# Patient Record
Sex: Female | Born: 1985 | Race: White | Hispanic: No | Marital: Married | Smoking: Never smoker
Health system: Southern US, Community
[De-identification: ages and names within clinical notes are randomized; demographics above are authoritative.]

## PROBLEM LIST (undated history)

## (undated) DIAGNOSIS — J45909 Unspecified asthma, uncomplicated: Secondary | ICD-10-CM

## (undated) HISTORY — PX: TONSILLECTOMY: SUR1361

## (undated) HISTORY — PX: SEPTOPLASTY: SHX2393

---

## 2020-04-18 ENCOUNTER — Ambulatory Visit
Admission: EM | Admit: 2020-04-18 | Discharge: 2020-04-18 | Disposition: A | Discharge status: Home / Self Care | Payer: Managed Care, Other (non HMO)

## 2020-04-18 ENCOUNTER — Other Ambulatory Visit: Payer: Self-pay

## 2020-04-18 ENCOUNTER — Encounter: Payer: Self-pay | Admitting: Emergency Medicine

## 2020-04-18 DIAGNOSIS — M79672 Pain in left foot: Secondary | ICD-10-CM | POA: Diagnosis not present

## 2020-04-18 DIAGNOSIS — L03116 Cellulitis of left lower limb: Secondary | ICD-10-CM

## 2020-04-18 HISTORY — DX: Unspecified asthma, uncomplicated: J45.909

## 2020-04-18 MED ORDER — DOXYCYCLINE HYCLATE 100 MG PO CAPS
100.0000 mg | ORAL_CAPSULE | Freq: Two times a day (BID) | ORAL | 0 refills | Status: AC
Start: 2020-04-18 — End: ?

## 2020-04-18 NOTE — Discharge Instructions (Addendum)
Take the antibiotic if you are noticing more redness, tenderness, and swelling to the area by tomorrow  Take tylenol or ibuprofen as needed for pain  Follow up as needed

## 2020-04-18 NOTE — ED Triage Notes (Signed)
Pt states that over the weekend she was [place] the beach and stepped on a "barnicle".she states the pain id getting worse and wants to make sure it is not infected. She states her last tetanus was about a year ago.

## 2020-04-19 NOTE — ED Provider Notes (Signed)
[facility]   [id number] 04/18/20 Arrival Time: 1730  GY:JEHUD PAIN  SUBJECTIVE: History from: patient. Jordan Ray is a 34 y.o. female complains of left foot pain that began 3 days ago. Reports that she was a the beach over the weekend and stepped on a barnacle. Reports painful puncture wound to the middle of the bottom of her left foot. Denies drainage. Describes the pain as intermittent and achy in character. Has tried OTC medications without relief. Symptoms are made worse with being on her feet. Denies similar symptoms in the past. Denies fever, chills, erythema, ecchymosis, effusion, weakness, numbness and tingling, saddle paresthesias, loss of bowel or bladder function.      ROS: As per HPI.  All other pertinent ROS negative.     Past Medical History:  Diagnosis Date  . Asthma    Past Surgical History:  Procedure Laterality Date  . SEPTOPLASTY    . TONSILLECTOMY     Allergies  Allergen Reactions  . Cephalosporins Swelling   No current facility-administered medications on file prior to encounter.   Current Outpatient Medications on File Prior to Encounter  Medication Sig Dispense Refill  . albuterol (VENTOLIN HFA) 108 (90 Base) MCG/ACT inhaler Inhale into the lungs every 6 (six) hours as needed for wheezing or shortness of breath.    . loratadine (CLARITIN) 10 MG tablet Take 10 mg by mouth daily.    Marland Kitchen spironolactone (ALDACTONE) 50 MG tablet Take 50 mg by mouth daily.    Marland Kitchen venlafaxine XR (EFFEXOR XR) 75 MG 24 hr capsule Take 75 mg by mouth daily with breakfast.     Social History   Socioeconomic History  . Marital status: Married    Spouse name: Not on file  . Number of children: Not on file  . Years of education: Not on file  . Highest education level: Not on file  Occupational History  . Not on file  Tobacco Use  . Smoking status: Never Smoker  . Smokeless tobacco: Never Used  Substance and Sexual Activity  . Alcohol use: Not Currently  .  Drug use: Not Currently  . Sexual activity: Not on file  Other Topics Concern  . Not on file  Social History Narrative  . Not on file   Social Determinants of Health   Financial Resource Strain:   . Difficulty of Paying Living Expenses:   Food Insecurity:   . Worried About Charity fundraiser in the Last Year:   . Arboriculturist in the Last Year:   Transportation Needs:   . Film/video editor (Medical):   Marland Kitchen Lack of Transportation (Non-Medical):   Physical Activity:   . Days of Exercise per Week:   . Minutes of Exercise per Session:   Stress:   . Feeling of Stress :   Social Connections:   . Frequency of Communication with Friends and Family:   . Frequency of Social Gatherings with Friends and Family:   . Attends Religious Services:   . Active Member of Clubs or Organizations:   . Attends Archivist Meetings:   Marland Kitchen Marital Status:   Intimate Partner Violence:   . Fear of Current or Ex-Partner:   . Emotionally Abused:   Marland Kitchen Physically Abused:   . Sexually Abused:    Family History  Problem Relation Age of Onset  . Healthy Mother   . Healthy Father     OBJECTIVE:  Vitals:   04/18/20 1732  BP: 116/76  Pulse:  83  Resp: 18  Temp: 98.6 F (37 C)  TempSrc: Oral  SpO2: 97%  Weight: 150 lb (68 kg)  Height: 5\' 3"  (1.6 m)    General appearance: ALERT; in no acute distress.  Head: NCAT Lungs: Normal respiratory effort CV: pedal pulses 2+ bilaterally. Cap refill < 2 seconds Musculoskeletal:  Inspection: Skin warm, dry, clear and intact without obvious erythema, effusion, or ecchymosis.  Palpation: Nontender to palpation ROM: FROM active and passive Skin: warm and dry, 4mm puncture wound to middle of sole of L foot, no drainage, skin is intact, erythematous, tender to palpation Neurologic: Ambulates without difficulty; Sensation intact about the upper/ lower extremities Psychological: alert and cooperative; normal mood and affect  DIAGNOSTIC  STUDIES:  No results found.   ASSESSMENT & PLAN:  1. Left foot pain   2. Cellulitis of left lower extremity      Meds ordered this encounter  Medications  . doxycycline (VIBRAMYCIN) 100 MG capsule    Sig: Take 1 capsule (100 mg total) by mouth 2 (two) times daily.    Dispense:  14 capsule    Refill:  0    Order Specific Question:   Supervising Provider    Answer:   1m Merrilee Jansky    Left Foot Pain Cellulitis Prescribed doxycycline Take as directed and to completion Continue conservative management of rest, ice, and gentle stretches Take naproxen as needed for pain relief (may cause abdominal discomfort, ulcers, and GI bleeds avoid taking with other NSAIDs) Follow up with PCP if symptoms persist Return or go to the ER if you have any new or worsening symptoms (fever, chills, chest pain, abdominal pain, changes in bowel or bladder habits, pain radiating into lower legs)   Reviewed expectations re: course of current medical issues. Questions answered. Outlined signs and symptoms indicating need for more acute intervention. Patient verbalized understanding. After Visit Summary given.       [[id number]], NP 04/19/20 [phone]

## 2020-06-20 ENCOUNTER — Other Ambulatory Visit: Payer: Self-pay

## 2020-06-20 ENCOUNTER — Ambulatory Visit
Admission: EM | Admit: 2020-06-20 | Discharge: 2020-06-20 | Disposition: A | Discharge status: Home / Self Care | Payer: Managed Care, Other (non HMO) | Attending: Emergency Medicine | Admitting: Emergency Medicine

## 2020-06-20 DIAGNOSIS — R42 Dizziness and giddiness: Secondary | ICD-10-CM

## 2020-06-20 LAB — POCT URINALYSIS DIP (MANUAL ENTRY)
Bilirubin, UA: NEGATIVE
Blood, UA: NEGATIVE
Glucose, UA: NEGATIVE mg/dL
Ketones, POC UA: NEGATIVE mg/dL
Leukocytes, UA: NEGATIVE
Nitrite, UA: NEGATIVE
Protein Ur, POC: NEGATIVE mg/dL
Spec Grav, UA: 1.02 (ref 1.010–1.025)
Urobilinogen, UA: 0.2 E.U./dL
pH, UA: 7.5 (ref 5.0–8.0)

## 2020-06-20 LAB — POCT URINE PREGNANCY: Preg Test, Ur: NEGATIVE

## 2020-06-20 NOTE — Discharge Instructions (Addendum)
Go to the emergency department if you have acute worsening symptoms.    Your Covid test is pending.  You should self quarantine until the test result is back.    Follow-up with your primary care provider as scheduled next week.

## 2020-06-20 NOTE — ED Triage Notes (Signed)
Pt presents to UC for possible vertigo x4 days. Pt states she has had vertigo in past but has not lasted this long. Pt states she has treated with meclizine yesterday with out relief.

## 2020-06-20 NOTE — ED Provider Notes (Signed)
[facility]    CSN: [id number] Arrival date & time: 06/20/20  1523      History   Chief Complaint Chief Complaint  Patient presents with  . Dizziness    HPI Jordan Ray is a 34 y.o. female.   Patient presents with "disequilibrium" x4 days.  She states she has a history of vertigo.  She took OTC Dramamine without relief.  She denies fever, chills, cough, shortness of breath, chest pain, abdominal pain, or other symptoms.    The history is provided by the patient.    Past Medical History:  Diagnosis Date  . Asthma     There are no problems to display for this patient.   Past Surgical History:  Procedure Laterality Date  . SEPTOPLASTY    . TONSILLECTOMY      OB History   No obstetric history on file.      Home Medications    Prior to Admission medications   Medication Sig Start Date End Date Taking? Authorizing Provider  albuterol (VENTOLIN HFA) 108 (90 Base) MCG/ACT inhaler Inhale into the lungs every 6 (six) hours as needed for wheezing or shortness of breath.    [provider]  doxycycline (VIBRAMYCIN) 100 MG capsule Take 1 capsule (100 mg total) by mouth 2 (two) times daily. 04/18/20   Moshe Cipro, NP  loratadine (CLARITIN) 10 MG tablet Take 10 mg by mouth daily.    [provider]  spironolactone (ALDACTONE) 50 MG tablet Take 50 mg by mouth daily.    [provider]  venlafaxine XR (EFFEXOR XR) 75 MG 24 hr capsule Take 75 mg by mouth daily with breakfast.    [provider]    Family History Family History  Problem Relation Age of Onset  . Healthy Mother   . Healthy Father     Social History Social History   Tobacco Use  . Smoking status: Never Smoker  . Smokeless tobacco: Never Used  Vaping Use  . Vaping Use: Never used  Substance Use Topics  . Alcohol use: Not Currently  . Drug use: Not Currently     Allergies   Cephalosporins   Review of Systems Review of Systems    Constitutional: Negative for chills and fever.  HENT: Negative for congestion, ear pain and sore throat.   Eyes: Negative for pain and visual disturbance.  Respiratory: Negative for cough and shortness of breath.   Cardiovascular: Negative for chest pain and palpitations.  Gastrointestinal: Negative for abdominal pain, diarrhea and vomiting.  Genitourinary: Negative for dysuria and hematuria.  Musculoskeletal: Negative for arthralgias and back pain.  Skin: Negative for color change and rash.  Neurological: Positive for dizziness. Negative for seizures, syncope, facial asymmetry, speech difficulty, weakness, numbness and headaches.  All other systems reviewed and are negative.    Physical Exam Triage Vital Signs ED Triage Vitals  Enc Vitals Group     BP      Pulse      Resp      Temp      Temp src      SpO2      Weight      Height      Head Circumference      Peak Flow      Pain Score      Pain Loc      Pain Edu?      Excl. in GC?    No data found.  Updated Vital Signs BP 113/79 (BP  Location: Right Arm)   Pulse 83   Temp 98.2 F (36.8 C) (Oral)   Resp 16   SpO2 97%   Visual Acuity Right Eye Distance:   Left Eye Distance:   Bilateral Distance:    Right Eye Near:   Left Eye Near:    Bilateral Near:     Physical Exam Vitals and nursing note reviewed.  Constitutional:      General: She is not in acute distress.    Appearance: She is well-developed. She is not ill-appearing.  HENT:     Head: Normocephalic and atraumatic.     Right Ear: Tympanic membrane normal.     Left Ear: Tympanic membrane normal.     Nose: Nose normal.     Mouth/Throat:     Mouth: Mucous membranes are moist.     Pharynx: Oropharynx is clear.  Eyes:     Conjunctiva/sclera: Conjunctivae normal.  Cardiovascular:     Rate and Rhythm: Normal rate and regular rhythm.     Heart sounds: No murmur heard.   Pulmonary:     Effort: Pulmonary effort is normal. No respiratory distress.      Breath sounds: Normal breath sounds.  Abdominal:     Palpations: Abdomen is soft.     Tenderness: There is no abdominal tenderness. There is no guarding or rebound.  Musculoskeletal:     Cervical back: Neck supple.  Skin:    General: Skin is warm and dry.     Findings: No rash.  Neurological:     General: No focal deficit present.     Mental Status: She is alert and oriented to person, place, and time.     Cranial Nerves: No cranial nerve deficit.     Sensory: No sensory deficit.     Motor: No weakness.     Coordination: Romberg sign negative. Coordination normal.     Gait: Gait normal.  Psychiatric:        Mood and Affect: Mood normal.        Behavior: Behavior normal.      UC Treatments / Results  Labs (all labs ordered are listed, but only abnormal results are displayed) Labs Reviewed  NOVEL CORONAVIRUS, NAA  CBC  COMPREHENSIVE METABOLIC PANEL  POCT URINE PREGNANCY  POCT URINALYSIS DIP (MANUAL ENTRY)    EKG   Radiology No results found.  Procedures Procedures (including critical care time)  Medications Ordered in UC Medications - No data to display  Initial Impression / Assessment and Plan / UC Course  I have reviewed the triage vital signs and the nursing notes.  Pertinent labs & imaging results that were available during my care of the patient were reviewed by me and considered in my medical decision making (see chart for details).   Dizziness.  PCR COVID pending.  Instructed patient to self quarantine until her test result is back.  EKG shows sinus rhythm, rate 73, no ST elevation, no previous to compare.  Urine normal.  CBC, CMP pending.  Instructed patient to go to the ED if she has acute worsening symptoms.  PCR COVID pending.  Instructed her to self quarantine until the test result is back.  Instructed her to follow-up with her new PCP next week as scheduled.  Patient agrees to plan of care.          Final Clinical Impressions(s) / UC Diagnoses    Final diagnoses:  Dizziness     Discharge Instructions     Go to  the emergency department if you have acute worsening symptoms.    Your Covid test is pending.  You should self quarantine until the test result is back.    Follow-up with your primary care provider as scheduled next week.          ED Prescriptions    None     PDMP not reviewed this encounter.   Mickie Bail, NP 06/20/20 [phone]

## 2020-06-21 ENCOUNTER — Telehealth: Payer: Self-pay | Admitting: Emergency Medicine

## 2020-06-21 LAB — CBC
Hematocrit: 42.1 % (ref 34.0–46.6)
Hemoglobin: 13.8 g/dL (ref 11.1–15.9)
MCH: 30.7 pg (ref 26.6–33.0)
MCHC: 32.8 g/dL (ref 31.5–35.7)
MCV: 94 fL (ref 79–97)
Platelets: 412 10*3/uL (ref 150–450)
RBC: 4.49 x10E6/uL (ref 3.77–5.28)
RDW: 12.4 % (ref 11.7–15.4)
WBC: 8.6 10*3/uL (ref 3.4–10.8)

## 2020-06-21 LAB — COMPREHENSIVE METABOLIC PANEL
ALT: 19 IU/L (ref 0–32)
AST: 24 IU/L (ref 0–40)
Albumin/Globulin Ratio: 2.4 — ABNORMAL HIGH (ref 1.2–2.2)
Albumin: 5.3 g/dL — ABNORMAL HIGH (ref 3.8–4.8)
Alkaline Phosphatase: 48 IU/L (ref 48–121)
BUN/Creatinine Ratio: 14 (ref 9–23)
BUN: 10 mg/dL (ref 6–20)
Bilirubin Total: <0.2 mg/dL (ref 0.0–1.2)
CO2: 22 mmol/L (ref 20–29)
Calcium: 9.7 mg/dL (ref 8.7–10.2)
Chloride: 103 mmol/L (ref 96–106)
Creatinine, Ser: 0.7 mg/dL (ref 0.57–1.00)
GFR calc Af Amer: 132 mL/min/{1.73_m2} (ref >59)
GFR calc non Af Amer: 114 mL/min/{1.73_m2} (ref >59)
Globulin, Total: 2.2 g/dL (ref 1.5–4.5)
Glucose: 75 mg/dL (ref 65–99)
Potassium: 4.4 mmol/L (ref 3.5–5.2)
Sodium: 141 mmol/L (ref 134–144)
Total Protein: 7.5 g/dL (ref 6.0–8.5)

## 2020-06-21 NOTE — Telephone Encounter (Signed)
Called patient and reviewed lab results.  Instructed her to follow up with her PCP as scheduled/needed.  Patient agrees to plan of care.

## 2020-06-22 LAB — NOVEL CORONAVIRUS, NAA: SARS-CoV-2, NAA: NOT DETECTED

## 2020-06-22 LAB — SARS-COV-2, NAA 2 DAY TAT

## 2020-06-30 ENCOUNTER — Other Ambulatory Visit: Payer: Self-pay | Admitting: Family Medicine

## 2020-06-30 DIAGNOSIS — Z1231 Encounter for screening mammogram for malignant neoplasm of breast: Secondary | ICD-10-CM

## 2020-07-06 ENCOUNTER — Ambulatory Visit
Admission: RE | Admit: 2020-07-06 | Discharge: 2020-07-06 | Disposition: A | Discharge status: Home / Self Care | Payer: Managed Care, Other (non HMO) | Source: Ambulatory Visit | Attending: Family Medicine | Admitting: Family Medicine

## 2020-07-06 ENCOUNTER — Other Ambulatory Visit: Payer: Self-pay

## 2020-07-06 DIAGNOSIS — Z1231 Encounter for screening mammogram for malignant neoplasm of breast: Secondary | ICD-10-CM

## 2020-09-28 IMAGING — MG DIGITAL SCREENING BILAT W/ TOMO W/ CAD
8 series · 9 of 24 positions shown · non-contrast
Comparison: None.

CLINICAL DATA: Screening. The patient has a cousin who is BRCA
positive.

EXAM:
DIGITAL SCREENING BILATERAL MAMMOGRAM WITH TOMO AND CAD

[R MLO synth-2D]
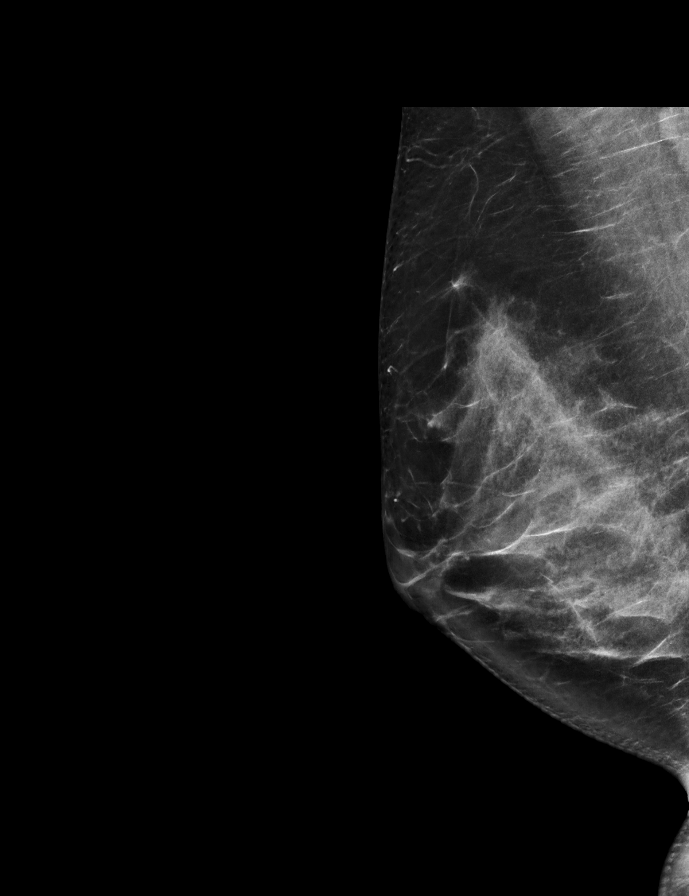

[R CC synth-2D]
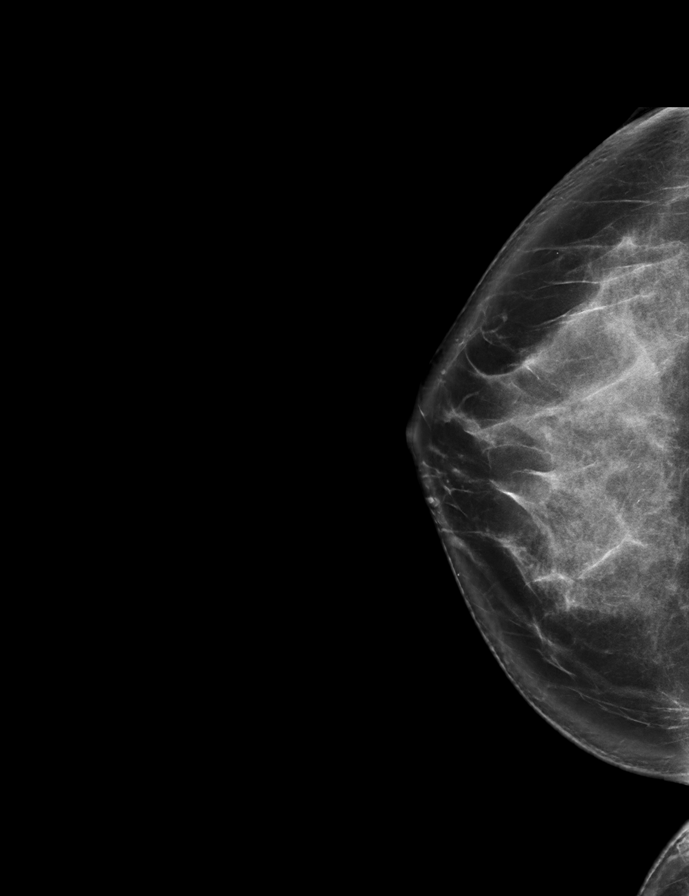

[L MLO synth-2D]
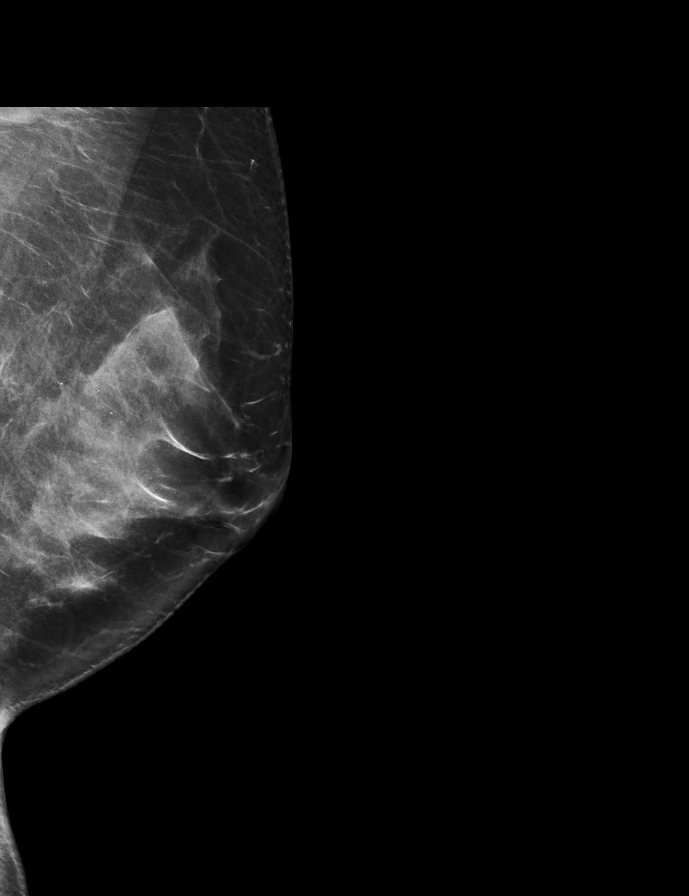

[L CC synth-2D]
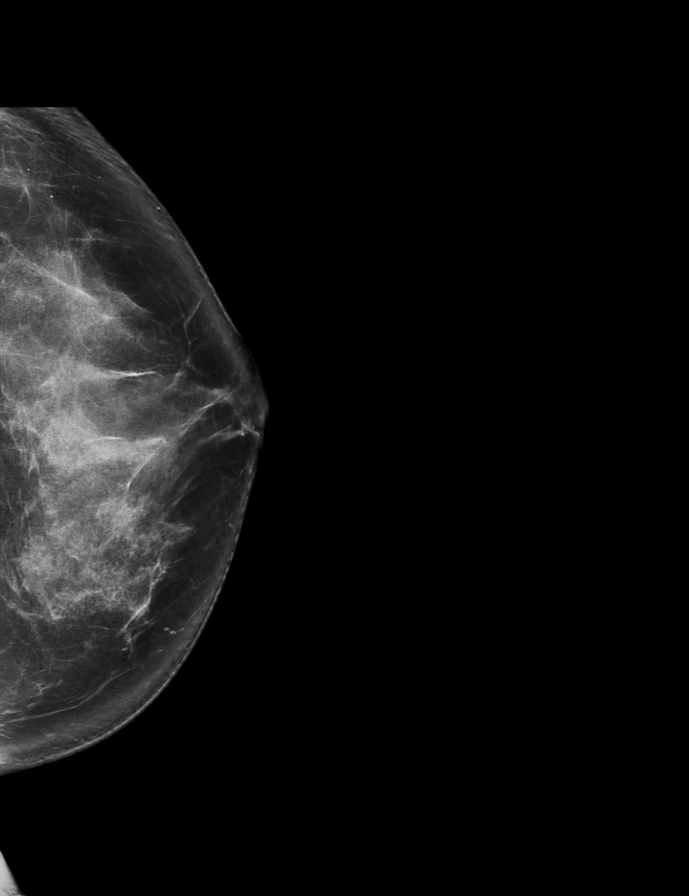

[R CC tomo · 2 of 85 frames shown]
[frame 28/85]
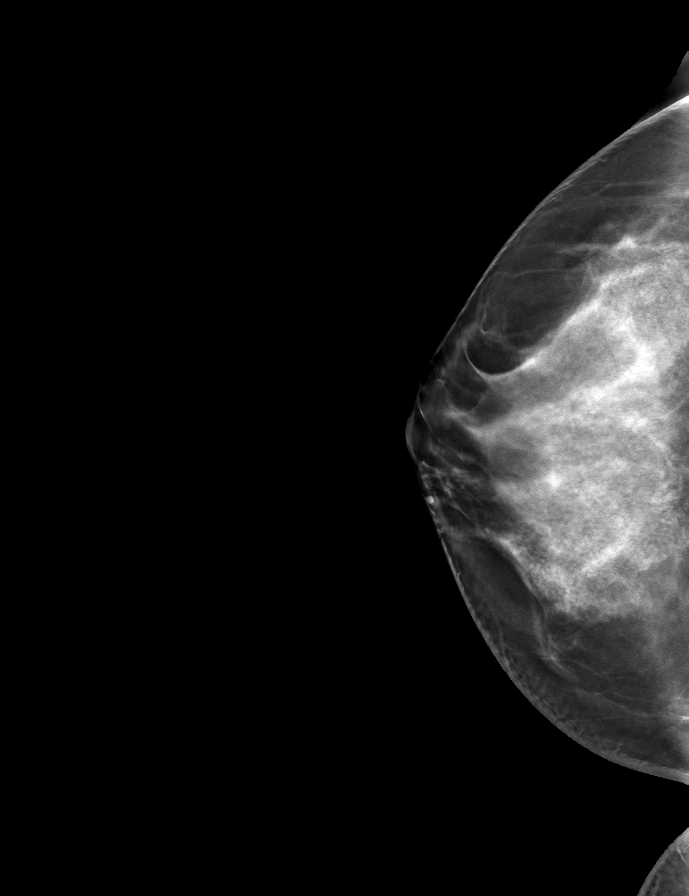
[frame 43/85]
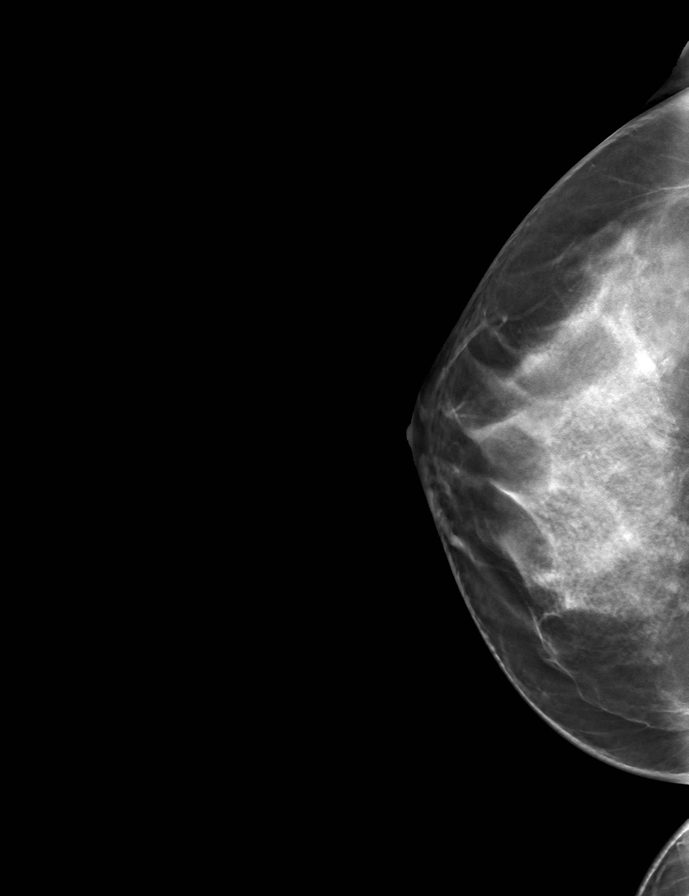

[R MLO tomo · tomo slice 39/76.0]
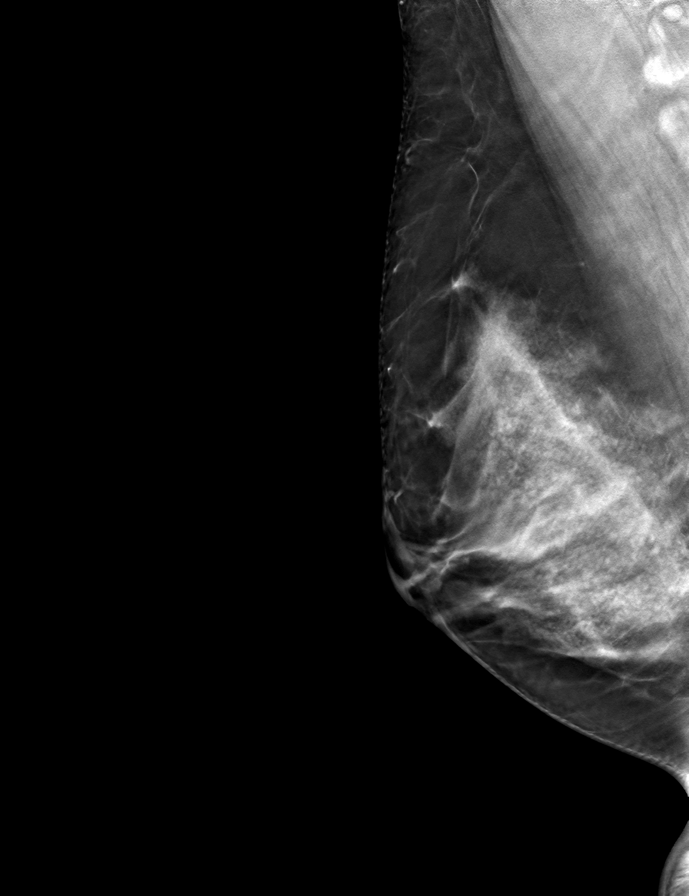

[L MLO tomo · tomo slice 41/81.0]
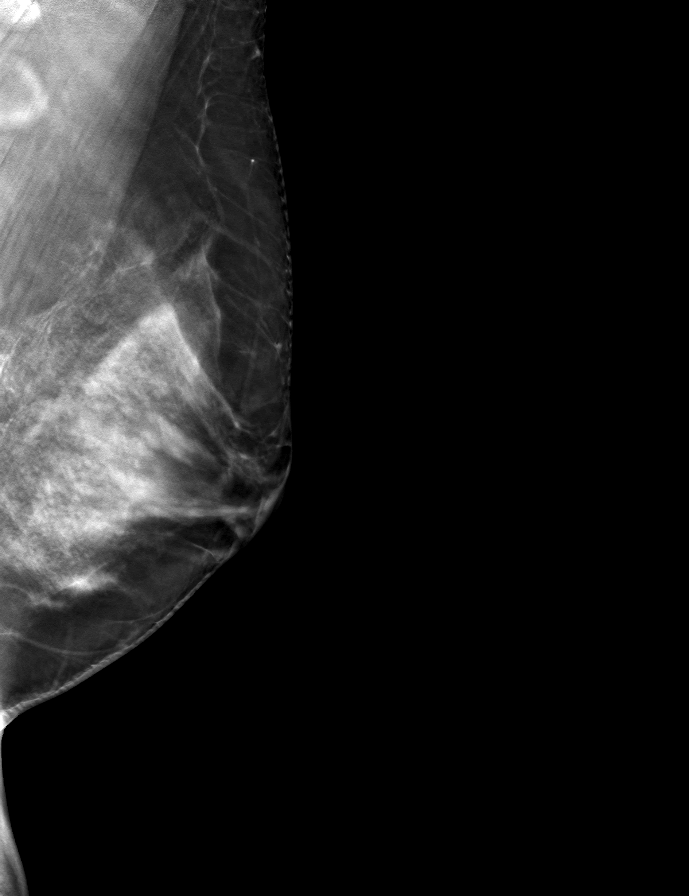

[L CC tomo · tomo slice 40/79.0]
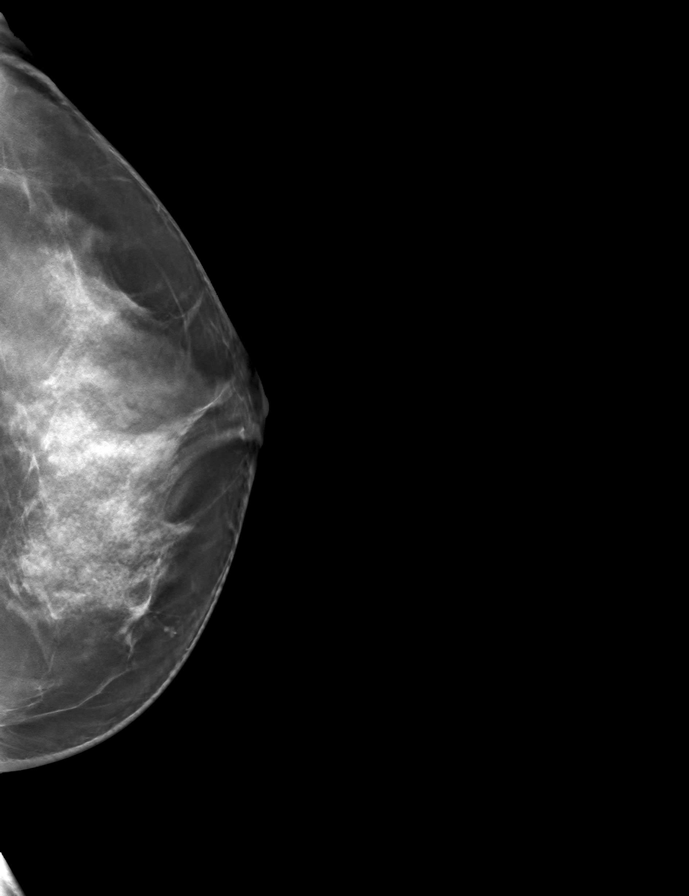

[9 of 24 positions shown; findings below may reference images not displayed]

ACR Breast Density Category d: The breast tissue is extremely dense,
which lowers the sensitivity of mammography.
FINDINGS: There are no findings suspicious for malignancy. Images were
processed with CAD.
IMPRESSION: No mammographic evidence of malignancy. A result letter of this
screening mammogram will be mailed directly to the patient.

RECOMMENDATION:
Screening mammogram at age 41. (Code:0I-9-EFW)

BI-RADS CATEGORY  1: Negative.

## 2021-03-12 ENCOUNTER — Other Ambulatory Visit (HOSPITAL_COMMUNITY): Payer: Self-pay | Admitting: *Deleted

## 2021-03-13 ENCOUNTER — Other Ambulatory Visit: Payer: Self-pay

## 2021-03-13 ENCOUNTER — Ambulatory Visit (HOSPITAL_COMMUNITY)
Admission: RE | Admit: 2021-03-13 | Discharge: 2021-03-13 | Disposition: A | Discharge status: Home / Self Care | Payer: Managed Care, Other (non HMO) | Source: Ambulatory Visit | Attending: Cardiology | Admitting: Cardiology

## 2021-06-05 IMAGING — CT CT CARDIAC CORONARY ARTERY CALCIUM SCORE
3 series · 14 of 20 positions shown, 15 images · non-contrast
Comparison: None

Addendum:
CLINICAL DATA: Risk stratification: 34 Year-old female

EXAM:
Coronary Calcium Score
TECHNIQUE: The patient was scanned on a Siemens Force scanner. Axial
non-contrast 3 mm slices were carried out through the heart. The
data set was analyzed on a dedicated work station and scored using
the Agatson method.

[Series 3: 2 hrt calcium · axial · 0.39mm/px · z∈[-250,-168]mm · 4 of 45 slices shown, 5 images]
[im 9/45  vessel]
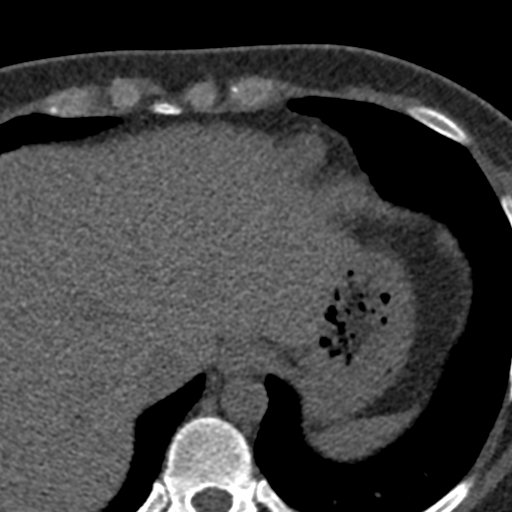
[im 9/45  lung]
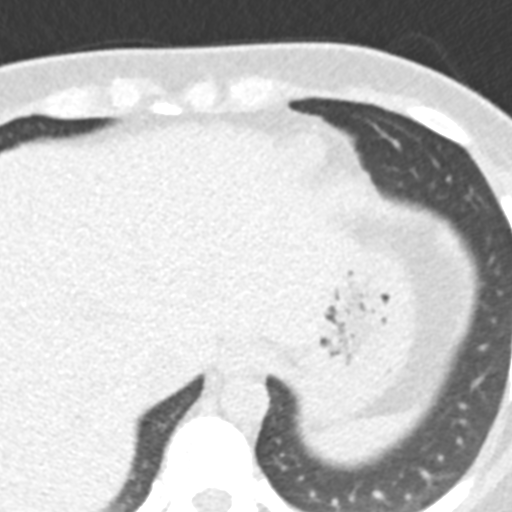
[im 18/45  vessel]
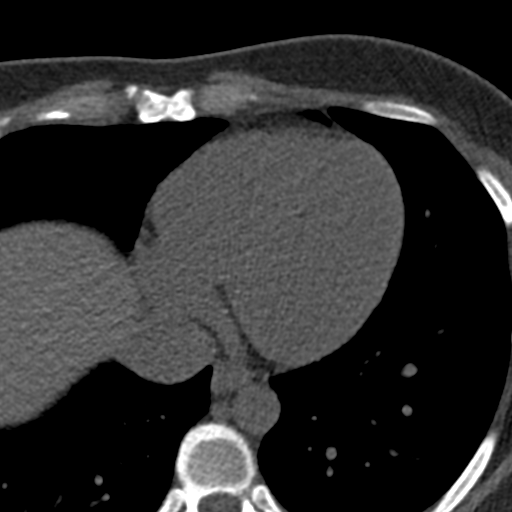
[im 27/45  vessel]
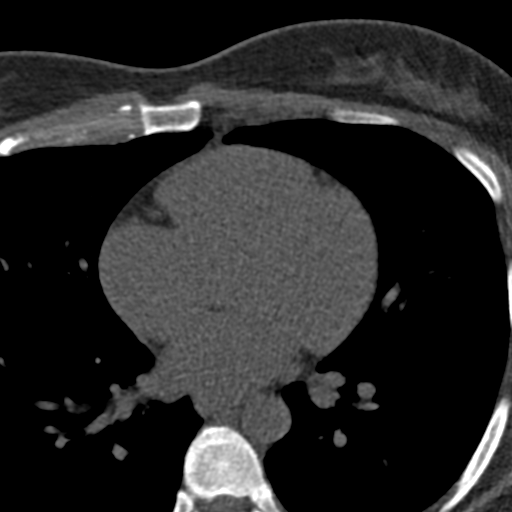
[im 36/45  vessel]
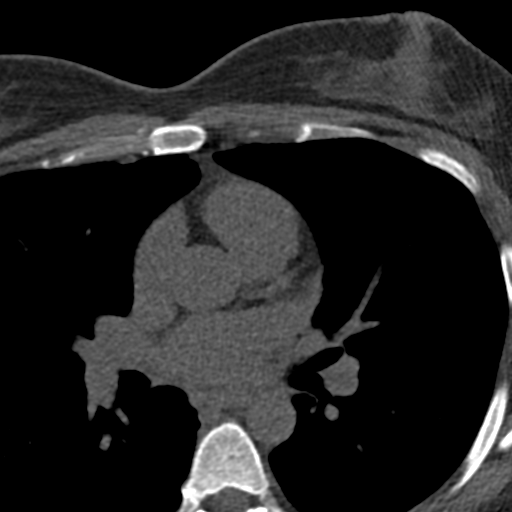

[Series 4: 2 soft full fov 71 % · axial · 0.65mm/px · z∈[-252,-166]mm · 5 of 45 slices shown]
[im 8/45  vessel]
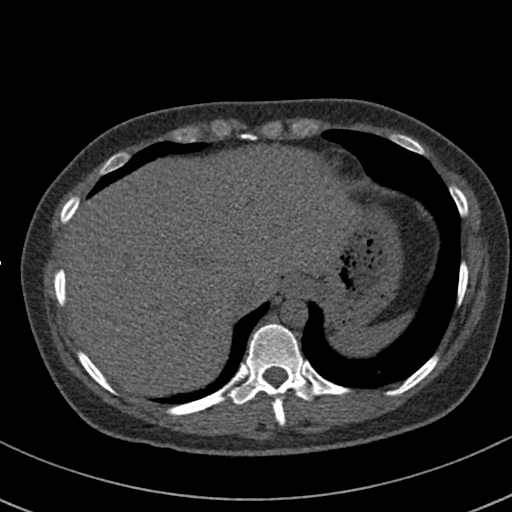
[im 15/45  vessel]
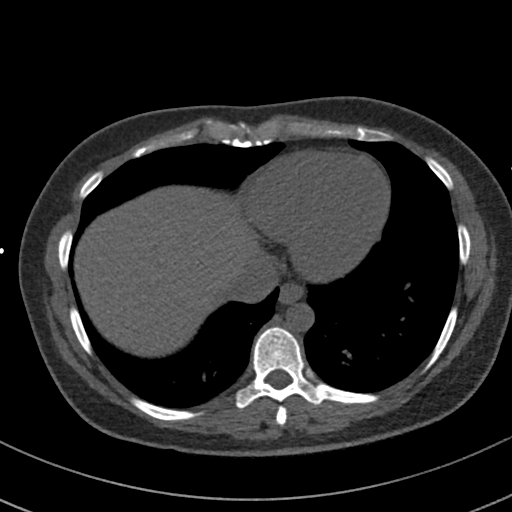
[im 23/45  vessel]
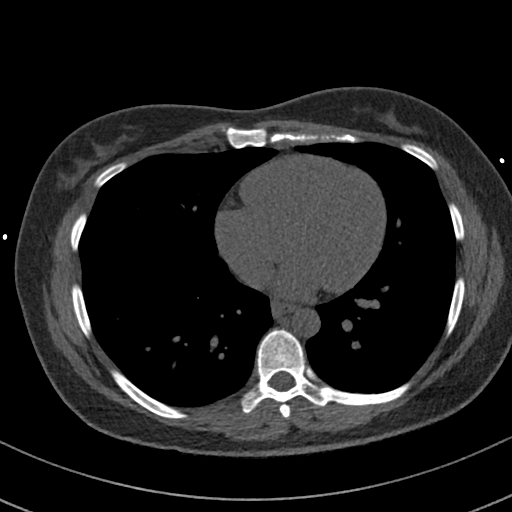
[im 30/45  vessel]
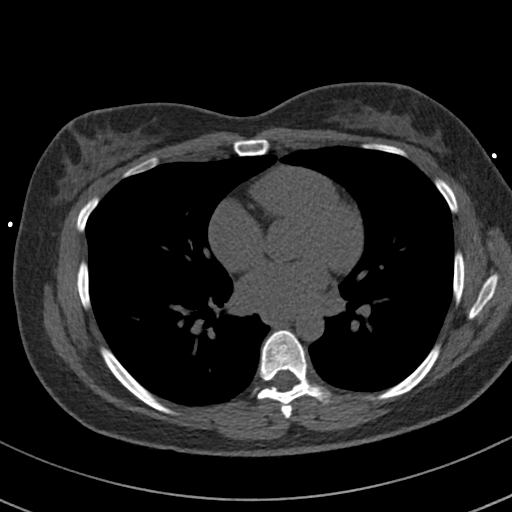
[im 37/45  vessel]
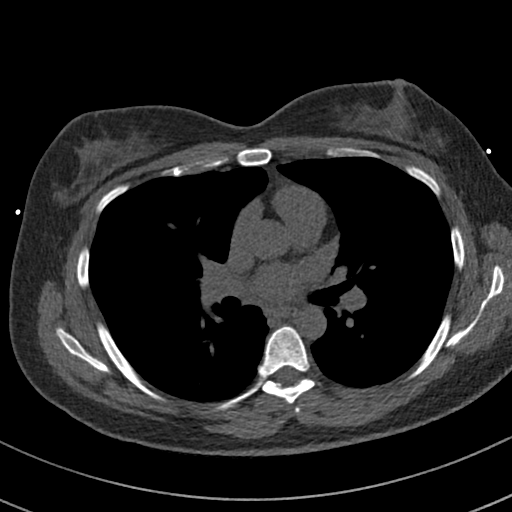

[Series 6: 2 lungs 71 % · axial · 0.65mm/px · z∈[-252,-166]mm · 5 of 45 slices shown]
[im 8/45  vessel]
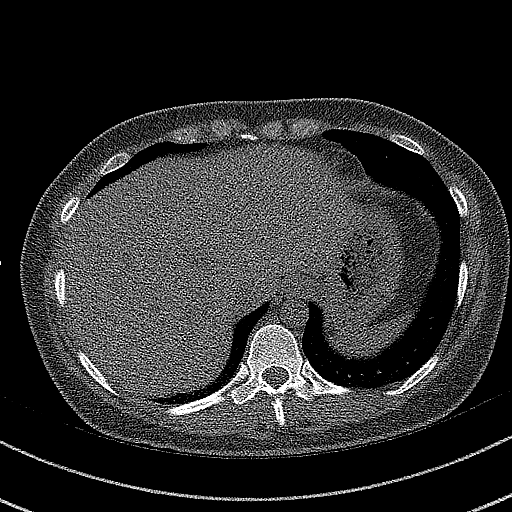
[im 15/45  vessel]
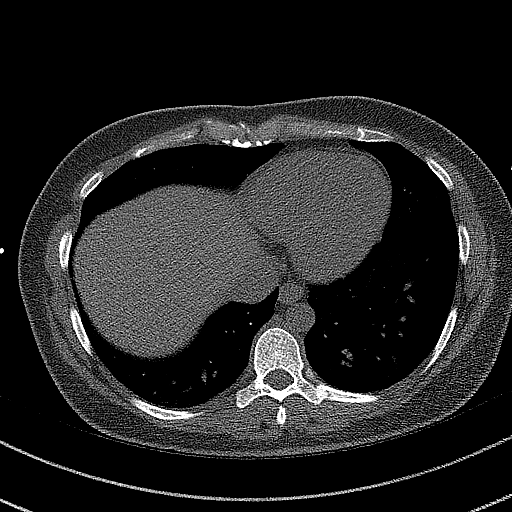
[im 23/45  vessel]
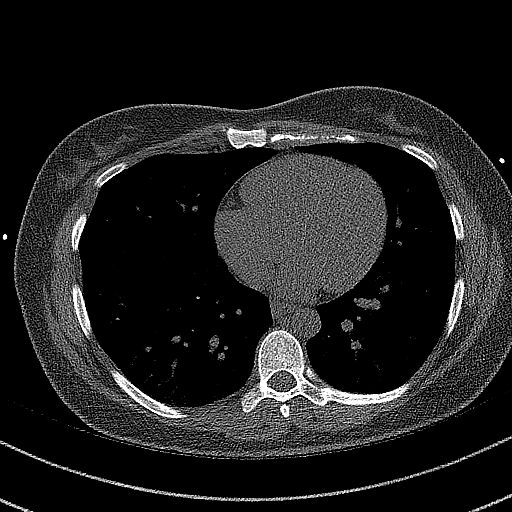
[im 30/45  vessel]
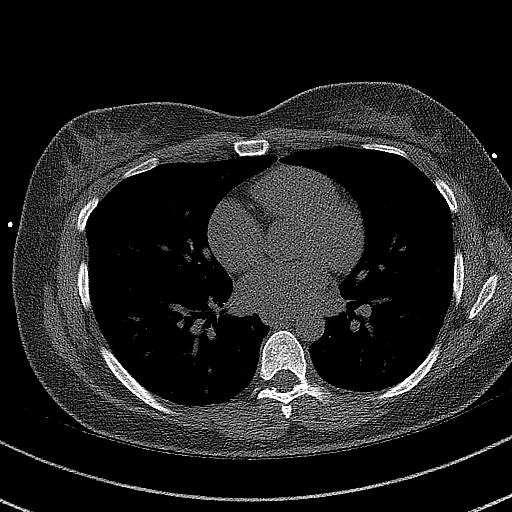
[im 37/45  vessel]
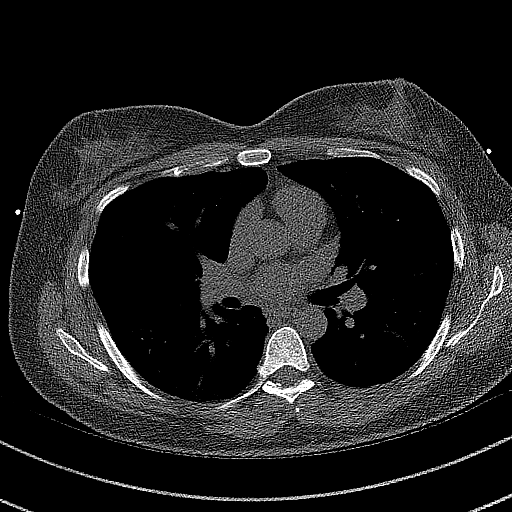

[14 of 20 positions shown; findings below may reference images not displayed]

FINDINGS: Non-cardiac: See separate report from [REDACTED].

Ascending Aorta: Normal caliber.

Pericardium: Normal.

Coronary arteries: Normal origins.

Coronary Calcium Score:

Left main: 0

Left anterior descending artery: 0

Left circumflex artery: 0

Right coronary artery: 0

Total: 0

Percentile: 1st for age, sex, and race matched control.
IMPRESSION: 1. Coronary calcium score of 0. This was 1st percentile for age,
gender, and race matched controls.

RECOMMENDATIONS:



If CAC = 0, it is reasonable to withhold statin therapy and reassess
in 5 to 10 years, as long as higher risk conditions are absent
(diabetes mellitus, family history of premature CHD in first degree
relatives (males <55 years; females <65 years), cigarette smoking,
LDL >=190 mg/dL or other independent risk factors).

If CAC is 1 to 99, it is reasonable to initiate statin therapy for
patients ?55 years of age.

If CAC is >=100 or >=75th percentile, it is reasonable to initiate
statin therapy at any age.

Cardiology referral should be considered for patients with CAC
scores =400 or >=75th percentile.

*8679 AHA/ACC/AACVPR/AAPA/ABC/DEYOUNG/TIGER/MAROTTA/Abdirahman/MOLA/CAROD/ONNIS
Guideline on the Management of Blood Cholesterol: A Report of the
American College of Cardiology/American Heart Association Task Force
on Clinical Practice Guidelines. J Am Coll Cardiol.
9399;73(24):5682-5796.

ADDENDUM:
OVER-READ INTERPRETATION  CT CHEST

The following report is an over-read performed by radiologist Dr.
over-read does not include interpretation of cardiac or coronary
anatomy or pathology. The coronary calcium interpretation by the
cardiologist is attached.
FINDINGS: Cardiovascular: No visible aortic atherosclerosis. Normal caliber of
the thoracic aorta. Normal heart size without pericardial effusion.
Limited imaging of the chest, coverage includes only the mid chest.

Mediastinum/Nodes: No visible adenopathy on limited assessment.

Lungs/Pleura: No effusion. No consolidation. Tiny 4 mm nodule in the
lingula. Visualized airways are patent.

Upper Abdomen: Incidental imaging of upper abdominal contents is
unremarkable.

Musculoskeletal: No acute bone finding or destructive bone process.
IMPRESSION: 4 mm lingular nodule more likely benign given size and patient age.
Could consider a 12 month follow-up if the patient is high risk
though there are no specific guidelines below age 35 for follow-up
of a nodule of this size.

*** End of Addendum ***
FINDINGS: Non-cardiac: See separate report from [REDACTED].

Ascending Aorta: Normal caliber.

Pericardium: Normal.

Coronary arteries: Normal origins.

Coronary Calcium Score:

Left main: 0

Left anterior descending artery: 0

Left circumflex artery: 0

Right coronary artery: 0

Total: 0

Percentile: 1st for age, sex, and race matched control.
IMPRESSION: 1. Coronary calcium score of 0. This was 1st percentile for age,
gender, and race matched controls.

RECOMMENDATIONS:



If CAC = 0, it is reasonable to withhold statin therapy and reassess
in 5 to 10 years, as long as higher risk conditions are absent
(diabetes mellitus, family history of premature CHD in first degree
relatives (males <55 years; females <65 years), cigarette smoking,
LDL >=190 mg/dL or other independent risk factors).

If CAC is 1 to 99, it is reasonable to initiate statin therapy for
patients ?55 years of age.

If CAC is >=100 or >=75th percentile, it is reasonable to initiate
statin therapy at any age.

Cardiology referral should be considered for patients with CAC
scores =400 or >=75th percentile.

*8679 AHA/ACC/AACVPR/AAPA/ABC/DEYOUNG/TIGER/MAROTTA/Abdirahman/MOLA/CAROD/ONNIS
Guideline on the Management of Blood Cholesterol: A Report of the
American College of Cardiology/American Heart Association Task Force
on Clinical Practice Guidelines. J Am Coll Cardiol.
9399;73(24):5682-5796.
